# Patient Record
Sex: Female | Born: 1960 | Race: White | Hispanic: No | Smoking: Current every day smoker
Health system: Southern US, Community
[De-identification: ages and names within clinical notes are randomized; demographics above are authoritative.]

---

## 2015-03-08 DIAGNOSIS — I10 Essential (primary) hypertension: Secondary | ICD-10-CM | POA: Insufficient documentation

## 2015-03-08 DIAGNOSIS — R87613 High grade squamous intraepithelial lesion on cytologic smear of cervix (HGSIL): Secondary | ICD-10-CM | POA: Insufficient documentation

## 2015-03-08 DIAGNOSIS — F330 Major depressive disorder, recurrent, mild: Secondary | ICD-10-CM | POA: Insufficient documentation

## 2015-03-08 DIAGNOSIS — M810 Age-related osteoporosis without current pathological fracture: Secondary | ICD-10-CM | POA: Insufficient documentation

## 2016-01-03 DIAGNOSIS — F17210 Nicotine dependence, cigarettes, uncomplicated: Secondary | ICD-10-CM | POA: Insufficient documentation

## 2016-10-20 DIAGNOSIS — K580 Irritable bowel syndrome with diarrhea: Secondary | ICD-10-CM | POA: Insufficient documentation

## 2016-10-20 DIAGNOSIS — Z6820 Body mass index (BMI) 20.0-20.9, adult: Secondary | ICD-10-CM | POA: Insufficient documentation

## 2017-05-29 DIAGNOSIS — R918 Other nonspecific abnormal finding of lung field: Secondary | ICD-10-CM | POA: Insufficient documentation

## 2018-06-05 DIAGNOSIS — E782 Mixed hyperlipidemia: Secondary | ICD-10-CM | POA: Insufficient documentation

## 2018-12-05 DIAGNOSIS — Z681 Body mass index (BMI) 19 or less, adult: Secondary | ICD-10-CM | POA: Insufficient documentation

## 2018-12-05 DIAGNOSIS — L409 Psoriasis, unspecified: Secondary | ICD-10-CM | POA: Insufficient documentation

## 2019-06-09 DIAGNOSIS — E559 Vitamin D deficiency, unspecified: Secondary | ICD-10-CM | POA: Insufficient documentation

## 2019-11-02 DIAGNOSIS — M25552 Pain in left hip: Secondary | ICD-10-CM | POA: Insufficient documentation

## 2019-11-19 ENCOUNTER — Other Ambulatory Visit: Payer: Self-pay

## 2019-11-19 ENCOUNTER — Encounter: Payer: Self-pay | Admitting: Family Medicine

## 2019-11-19 ENCOUNTER — Ambulatory Visit: Payer: 59 | Admitting: Family Medicine

## 2019-11-19 VITALS — BP 120/72 | HR 87 | Ht 66.0 in | Wt 122.0 lb

## 2019-11-19 DIAGNOSIS — M533 Sacrococcygeal disorders, not elsewhere classified: Secondary | ICD-10-CM | POA: Diagnosis not present

## 2019-11-19 DIAGNOSIS — M25551 Pain in right hip: Secondary | ICD-10-CM | POA: Diagnosis not present

## 2019-11-19 DIAGNOSIS — M25552 Pain in left hip: Secondary | ICD-10-CM

## 2019-11-19 NOTE — Progress Notes (Signed)
Subjective:    CC: Low back pain  I, Raven Washington, am serving as a Neurosurgeon for Dr. Clementeen Washington.  HPI: Pt is a 59 y/o female presenting w/ c/o low back pain after suffering a fall on 10/04/19.  She locates her pain to across lower back from hip to hip when walking.  Had x-rays on 6/30 at her PCP brought CD. States PCP did not see anything wrong. On 7/12 went to [facility] and was told it was arthritis.   Radiating pain: yes from hip to hip LE numbness/tingling: no  LE weakness: yes R leg  Aggravating factors: walking too long Treatments tried: aleve, was given hydrocodone which has helped but doesn't like to take   Pertinent review of Systems: No fevers or chills  Relevant historical information: History pelvic fracture 2012 with pelvic hardware.   Objective:    Vitals:   11/19/19 1413  BP: 120/72  Pulse: 87  SpO2: (!) 87%   General: Well Developed, well nourished, and in no acute distress.   MSK:  LS-spine normal-appearing Tender palpation sacrum and coccyx. Normal lumbar motion. Lower extremity strength intact except noted below.  Right hip normal-appearing normal motion. Tender palpation greater trochanter. Hip abduction and external rotation strength diminished 4/5 with pain.  Left hip normal-appearing normal motion. Tender palpation greater trochanter Hip abduction and external rotation strength diminished 4/5 with pain.  Antalgic gait.  Lab and Radiology Results  X-RAY LEFT HIP (2+ VIEWS), 10/27/2019 11:59 AM   INDICATION: Left hip pain \ M25.552 Left hip pain  COMPARISON: None.   CONCLUSION:   1. No acute fracture or malalignment.  2. Mild degenerative changes of the left hip and sacroiliac joints  INDICATION: Low back and right hip pain   COMPARISON: None   TECHNIQUE: Lumbar spine 2 views. AP pelvis and right hip 2 views.   FINDINGS:   Lumbar spine: Osteopenia. Thoracolumbar dextroscoliosis. The disc spaces are preserved.  Small anterior osteophytes at L3 and L4. No fracture, dislocation, or destructive lesion   Pelvis/right hip: Orthopedic hardware right acetabulum. No abnormal periprosthetic lucencies. Osteopenia and moderate DJD right hip. No fracture, dislocation, or destructive lesion.    IMPRESSION:  1. Lumbar spine: Osteopenia, scoliosis, and mild spondylosis  2. Pelvis/right hip: Orthopedic hardware right acetabulum and moderate DJD right hip.   Electronically Signed by: Bryna Colander Specimen Collected: 10/15/19 2:55 PM Last Resulted: 10/15/19 2:58 PM   INDICATION: Low back and right hip pain   COMPARISON: None   TECHNIQUE: Lumbar spine 2 views. AP pelvis and right hip 2 views.   FINDINGS:   Lumbar spine: Osteopenia. Thoracolumbar dextroscoliosis. The disc spaces are preserved. Small anterior osteophytes at L3 and L4. No fracture, dislocation, or destructive lesion   Pelvis/right hip: Orthopedic hardware right acetabulum. No abnormal periprosthetic lucencies. Osteopenia and moderate DJD right hip. No fracture, dislocation, or destructive lesion.    IMPRESSION:  1. Lumbar spine: Osteopenia, scoliosis, and mild spondylosis  2. Pelvis/right hip: Orthopedic hardware right acetabulum and moderate DJD right hip.   Electronically Signed by: Bryna Colander Specimen Collected: 10/15/19 2:55 PM Last Resulted: 10/15/19 2:58 PM     I, Raven Washington, personally (independently) visualized and performed the interpretation of the images attached in this note.  Images provided by patient on a CD.      Impression and Recommendations:    Assessment and Plan: 59 y.o. female with pelvis and hip pain following fall about 6 weeks ago.  X-ray imaging so  far is unrevealing however my interpretation images is somewhat concerning for lucency in the superior pelvic ramus near the surgical hardware.  I think it is reasonable to proceed with CT scan of the pelvis to double check no fracture.  Addition we will  proceed with physical therapy trial and will recheck in about 4 weeks or so..  ] Orders Placed This Encounter  Procedures  . CT PELVIS WO CONTRAST    Standing Status:   Future    Standing Expiration Date:   11/18/2020    Order Specific Question:   Is patient pregnant?    Answer:   No    Order Specific Question:   Preferred imaging location?    Answer:   [facility]    Order Specific Question:   Is Oral Contrast requested for this exam?    Answer:   No oral contrast    Order Specific Question:   Reason for No Oral Contrast    Answer:   Other    Order Specific Question:   Please answer why no oral contrast is requested    Answer:   not needed    Order Specific Question:   Radiology Contrast Protocol - do NOT remove file path    Answer:   [email or web address]  . Ambulatory referral to Physical Therapy    Referral Priority:   Routine    Referral Type:   Physical Medicine    Referral Reason:   Specialty Services Required    Requested Specialty:   Physical Therapy    Number of Visits Requested:   1   No orders of the defined types were placed in this encounter.   Discussed warning signs or symptoms. Please see discharge instructions. Patient expresses understanding.   The above documentation has been reviewed and is accurate and complete Raven Washington, M.D.

## 2019-11-19 NOTE — Patient Instructions (Signed)
Thank you for coming in today. Plan for PT and Ct scan of the pelvis.  You should hear about both soon.  Let me know if you have not been contacted to schedule PT and or the CT scan in about a week.   Lets recheck in 4 weeks or sooner if needed.  I will get the CT results to you ASAP and may office a face to face visit with the result.

## 2019-11-26 ENCOUNTER — Ambulatory Visit (INDEPENDENT_AMBULATORY_CARE_PROVIDER_SITE_OTHER): Payer: 59

## 2019-11-26 ENCOUNTER — Other Ambulatory Visit: Payer: Self-pay

## 2019-11-26 DIAGNOSIS — M25551 Pain in right hip: Secondary | ICD-10-CM

## 2019-11-26 DIAGNOSIS — M533 Sacrococcygeal disorders, not elsewhere classified: Secondary | ICD-10-CM | POA: Diagnosis not present

## 2019-11-26 DIAGNOSIS — M25552 Pain in left hip: Secondary | ICD-10-CM

## 2019-11-27 NOTE — Progress Notes (Signed)
CT scan shows possible sacral osteonecrosis.  This could be secondary to your previous pelvic trauma.  If needed radiology recommends MRI.  Otherwise nothing significantly wrong seen on CT scan.  Would you like me to try to proceed with MRI at this point or would like follow-up?  I think either way is reasonable.

## 2019-11-28 ENCOUNTER — Telehealth: Payer: Self-pay | Admitting: *Deleted

## 2019-11-28 DIAGNOSIS — M25551 Pain in right hip: Secondary | ICD-10-CM

## 2019-11-28 DIAGNOSIS — M899 Disorder of bone, unspecified: Secondary | ICD-10-CM

## 2019-11-28 DIAGNOSIS — M533 Sacrococcygeal disorders, not elsewhere classified: Secondary | ICD-10-CM

## 2019-11-28 NOTE — Telephone Encounter (Signed)
MRI pelvis ordered

## 2019-11-28 NOTE — Telephone Encounter (Signed)
Pt returned call in reference to CT results and she would like to move forward with MRI.

## 2019-12-14 ENCOUNTER — Ambulatory Visit (INDEPENDENT_AMBULATORY_CARE_PROVIDER_SITE_OTHER): Payer: 59

## 2019-12-14 ENCOUNTER — Other Ambulatory Visit: Payer: Self-pay

## 2019-12-14 DIAGNOSIS — M533 Sacrococcygeal disorders, not elsewhere classified: Secondary | ICD-10-CM | POA: Diagnosis not present

## 2019-12-14 DIAGNOSIS — M25552 Pain in left hip: Secondary | ICD-10-CM

## 2019-12-14 DIAGNOSIS — M899 Disorder of bone, unspecified: Secondary | ICD-10-CM | POA: Diagnosis not present

## 2019-12-14 DIAGNOSIS — M25551 Pain in right hip: Secondary | ICD-10-CM | POA: Diagnosis not present

## 2019-12-16 NOTE — Progress Notes (Signed)
MRI pelvis shows small subtle fractures through part of the sacrum which looks like sacral insufficiency fractures.  These are fractures that will make it away through the bone and the bone is weak.  I think it is likely that you did break your sacrum when you fell and was seen in the evidence of that on the CT scan and MRI.  Please schedule follow-up with me in the near future to review these findings in detail and discuss next steps.  Effectively this should heal.

## 2019-12-24 ENCOUNTER — Encounter: Payer: Self-pay | Admitting: Family Medicine

## 2019-12-24 ENCOUNTER — Other Ambulatory Visit: Payer: Self-pay

## 2019-12-24 ENCOUNTER — Ambulatory Visit: Payer: 59 | Admitting: Family Medicine

## 2019-12-24 VITALS — BP 102/70 | HR 75 | Ht 66.0 in | Wt 122.0 lb

## 2019-12-24 DIAGNOSIS — M8448XD Pathological fracture, other site, subsequent encounter for fracture with routine healing: Secondary | ICD-10-CM

## 2019-12-24 DIAGNOSIS — M8000XD Age-related osteoporosis with current pathological fracture, unspecified site, subsequent encounter for fracture with routine healing: Secondary | ICD-10-CM | POA: Diagnosis not present

## 2019-12-24 NOTE — Progress Notes (Signed)
I, Raven Washington, LAT, ATC, am serving as scribe for Dr. Clementeen Washington.  Raven Washington is a 59 y.o. female who presents to [facility] at [facility] today for f/u of low back / buttock pain that began after she fell and landed on her back/buttocks on 10/04/19.  She was last seen by Dr. Denyse Washington on 11/19/19 was referred for a pelvis CT and MRI.  Since then, pt reports that she is feeling better and reports less pain overall.  She states that once she gets moving, she's doing ok.  She does have an existing history of osteoporosis.  She notes that she has had Reclast infusion with little benefit in the past.  She has not been on Prolia or Evenity yet.  Diagnostic imaging: Pelvis MRI- 12/14/19; Pelvis CT- 11/26/19   Pertinent review of systems: No fevers or chills  Relevant historical information: Osteoporosis   Exam:  BP 102/70 (BP Location: Right Arm, Patient Position: Sitting, Cuff Size: Normal)   Pulse 75   Ht 5\' 6"  (1.676 m)   Wt 122 lb (55.3 kg)   SpO2 94%   BMI 19.69 kg/m  General: Well Developed, well nourished, and in no acute distress.   MSK: Hip normal gait    Lab and Radiology Results CT PELVIS WO CONTRAST  Result Date: 11/27/2019 CLINICAL DATA:  Pain following fall several weeks prior EXAM: CT PELVIS WITHOUT CONTRAST TECHNIQUE: Multidetector CT imaging of the pelvis was performed following the standard protocol without intravenous contrast. COMPARISON:  None. FINDINGS: Urinary Tract: Urinary bladder is midline. The urinary bladder wall thickness is within normal limits. No distal ureteral lesion evident. Bowel: Bowel in the pelvic regions is not dilated. There is no evidence suggesting bowel wall thickening or bowel obstruction. Vascular/Lymphatic: There are foci of calcification in the iliac arteries bilaterally. No aneurysm in these vessels evident. Reproductive: Uterus is anteverted. No pelvic mass is appreciable on this study. Other:  No abnormal fluid or  abscess in the pelvis. Musculoskeletal: The patient has had previous surgical fixation through the acetabulum and mid inferior right iliac bone due to previous trauma. Alignment in these areas is anatomic. There is also surgical fixation in the right superior pubic ramus with alignment anatomic. There is no acute fracture or evident dislocation. Note that there is sclerosis throughout much of the sacrum without similar changes elsewhere. Marrow signal is normal in the coccyx. No bony destruction appreciable. There is no appreciable joint space narrowing. No erosive change noted in the hip joint regions. IMPRESSION: 1. There is relative sclerosis throughout much of the sacrum bilaterally with appearance of the coccyx normal. The appearance is concerning for degree of sacral osteonecrosis. There is no frank bony destruction or erosion in this region. Question chronic osteonecrosis secondary to previous pelvic trauma. Potentially MR of the sacrum could be helpful to further evaluate with respect to avascular necrosis. 2. Postoperative changes throughout the right acetabulum and superior pubic ramus with alignment in these areas essentially anatomic. No osteonecrosis in these areas. 3. No fracture or dislocation. Hip joints appear essentially unremarkable bilaterally. No appreciable sacroiliitis. 4. There are foci of arterial vascular calcification iliac arteries. 5.  Study otherwise unremarkable. Electronically Signed   By: 01/27/2020 III M.D.   On: 11/27/2019 08:22   MR PELVIS WO CONTRAST  Result Date: 12/15/2019 CLINICAL DATA:  Bony lesions in the pelvis on CT scan. EXAM: MRI PELVIS WITHOUT CONTRAST TECHNIQUE: Multiplanar multisequence MR imaging of the pelvis was performed. No intravenous  contrast was administered. COMPARISON:  CT pelvis 11/26/2019 FINDINGS: Bones and joints:Vertical fractures are present in both sacral ala with a transverse fracture component in the sacrum at the S3 level in a  characteristic appearance for sacral insufficiency fracture. No bony expansion or asymmetric lesion. Questionable nondisplaced fracture or stress fracture the right L5 transverse process. We do not currently show a characteristic pattern for avascular necrosis. The CT findings in the pelvis are likely due to reactive sclerosis related to the insufficiency fracture. Prior right acetabular, iliac bone, and right superior pubic ramus reconstruction with associated metal hardware. Mildly asymmetric spurring of the right femoral head and acetabulum compared to the left likely related to the old trauma. Muscles and tendons Unremarkable Other findings No additional significant findings. IMPRESSION: 1. Vertical fractures in both sacral ala with a transverse fracture component in the sacrum at the S3 level in a characteristic appearance for sacral insufficiency fracture. No bony expansion or asymmetric lesion. The CT findings in the pelvis are likely due to reactive sclerosis related to the insufficiency fracture. No underlying malignancy detected. 2. Questionable nondisplaced fracture or stress fracture the right L5 transverse process. 3. Prior right acetabular, iliac bone, and right superior pubic ramus reconstruction with associated metal hardware. 4. Mildly asymmetric degenerative spurring of the right femoral head and acetabulum compared to the left likely related to the old trauma. Electronically Signed   By: Raven Washington M.D.   On: 12/15/2019 10:19  I, Raven Washington, personally (independently) visualized and performed the interpretation of the images attached in this note. Agree with radiology read regarding pelvis MRI.     Assessment and Plan: 59 y.o. 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  Fortunately clinically improving.  However patient does have persistent osteoporosis with now pathologic fracture.  She had Reclast in the past with not great improvement.  She  already is taking vitamin D and has great vitamin D levels when checked at her PCPs office recently.  Calcium levels were also checked and were normal.  First choice for osteoporosis management would be Evenity monthly for 1 year.  Second choice would be Prolia.  We will work on authorization for [employer] and if not able then Prolia.  Possible that her PCP could take over Evenity or Prolia.  Watchful waiting for her fracture and treatment as above for osteoporosis.  Ideally transfer care to PCP.      Discussed warning signs or symptoms. Please see discharge instructions. Patient expresses understanding.   The above documentation has been reviewed and is accurate and complete Raven Washington, M.D.  Total encounter time 30 minutes including face-to-face time with the patient and charting on the date of service.

## 2019-12-24 NOTE — Patient Instructions (Signed)
Thank you for coming in today. The fracture should continue to improve over time.  We want to prevent future fractures by improving the bone mineral density.  I will work on authorization for [employer] or [employer].  Your PCP could probably do the Prolia but I can get it started now if Evenity if not approved.   I will try to coordinate this with your PCP.    Romosozumab injection What is this medicine? ROMOSOZUMAB (roe moe SOZ ue mab) increases bone formation. It is used to treat osteoporosis in women. This medicine may be used for other purposes; ask your health care provider or pharmacist if you have questions. COMMON BRAND NAME(S): EVENITY What should I tell my health care provider before I take this medicine? They need to know if you have any of these conditions:  dental disease or wear dentures  heart disease  history of stroke  kidney disease  low levels of calcium in the blood  an unusual or allergic reaction to romosozumab, other medicines, foods, dyes or preservatives  pregnant or trying to get pregnant  breast-feeding How should I use this medicine? This medicine is for injection under the skin. It is given by a healthcare professional in a hospital or clinic setting. Talk to your pediatrician about the use of this medicine in children. Special care may be needed. Overdosage: If you think you have taken too much of this medicine contact a poison control center or emergency room at once. NOTE: This medicine is only for you. Do not share this medicine with others. What if I miss a dose? Keep appointments for follow-up doses. It is important not to miss your dose. Call your doctor or healthcare professional if you are unable to keep an appointment. What may interact with this medicine? Interactions are not expected. This list may not describe all possible interactions. Give your health care provider a list of all the medicines, herbs, non-prescription drugs, or dietary  supplements you use. Also tell them if you smoke, drink alcohol, or use illegal drugs. Some items may interact with your medicine. What should I watch for while using this medicine? Your condition will be monitored carefully while you are receiving this medicine. You may need blood work done while you are taking this medicine. Some people who take this medicine have severe bone, joint, or muscle pain. This medicine may also increase your risk for jaw problems or a broken thigh bone. Tell your healthcare professional right away if you have severe pain in your jaw, bones, joints, or muscles. Tell your healthcare professional if you have any pain that does not go away or that gets worse. You should make sure you get enough calcium and vitamin D while you are taking this medicine. Discuss the foods you eat and the vitamins you take with your healthcare professional. Tell your dentist and dental surgeon that you are taking this medicine. You should not have major dental surgery while on this medicine. See your dentist to have a dental exam and fix any dental problems before starting this medicine. Take good care of your teeth while on this medicine. Make sure you see your dentist for regular follow-up appointments. What side effects may I notice from receiving this medicine? Side effects that you should report to your doctor or health care professional as soon as possible:  allergic reactions like skin rash, itching or hives, swelling of the face, lips, or tongue  bone pain  chest pain or chest tightness  jaw  pain, especially after dental work  signs and symptoms of a stroke like changes in vision; confusion; trouble speaking or understanding; severe headaches; sudden numbness or weakness of the face, arm or leg; trouble walking; dizziness; loss of balance or coordination  signs and symptoms of low calcium like fast heartbeat; muscle cramps; muscle pain; pain, tingling, numbness in the hands or feet;  seizures Side effects that usually do not require medical attention (report these to your doctor or health care professional if they continue or are bothersome):  headache  joint pain  pain, redness, or irritation at site where injected  pain, tingling, numbness in the hands or feet  swelling of ankles, feet, hands  trouble sleeping This list may not describe all possible side effects. Call your doctor for medical advice about side effects. You may report side effects to FDA at 1-800-FDA-1088. Where should I keep my medicine? This medicine is given in a hospital or clinic and will not be stored at home. NOTE: This sheet is a summary. It may not cover all possible information. If you have questions about this medicine, talk to your doctor, pharmacist, or health care provider.  2020 Elsevier/Gold Standard (2017-07-26 01:15:32)   Denosumab injection What is this medicine? DENOSUMAB (den oh sue mab) slows bone breakdown. Prolia is used to treat osteoporosis in women after menopause and in men, and in people who are taking corticosteroids for 6 months or more. Delton See is used to treat a high calcium level due to cancer and to prevent bone fractures and other bone problems caused by multiple myeloma or cancer bone metastases. Delton See is also used to treat giant cell tumor of the bone. This medicine may be used for other purposes; ask your health care provider or pharmacist if you have questions. COMMON BRAND NAME(S): Prolia, XGEVA What should I tell my health care provider before I take this medicine? They need to know if you have any of these conditions:  dental disease  having surgery or tooth extraction  infection  kidney disease  low levels of calcium or Vitamin D in the blood  malnutrition  on hemodialysis  skin conditions or sensitivity  thyroid or parathyroid disease  an unusual reaction to denosumab, other medicines, foods, dyes, or preservatives  pregnant or trying to  get pregnant  breast-feeding How should I use this medicine? This medicine is for injection under the skin. It is given by a health care professional in a hospital or clinic setting. A special MedGuide will be given to you before each treatment. Be sure to read this information carefully each time. For Prolia, talk to your pediatrician regarding the use of this medicine in children. Special care may be needed. For Delton See, talk to your pediatrician regarding the use of this medicine in children. While this drug may be prescribed for children as young as 13 years for selected conditions, precautions do apply. Overdosage: If you think you have taken too much of this medicine contact a poison control center or emergency room at once. NOTE: This medicine is only for you. Do not share this medicine with others. What if I miss a dose? It is important not to miss your dose. Call your doctor or health care professional if you are unable to keep an appointment. What may interact with this medicine? Do not take this medicine with any of the following medications:  other medicines containing denosumab This medicine may also interact with the following medications:  medicines that lower your chance of  fighting infection  steroid medicines like prednisone or cortisone This list may not describe all possible interactions. Give your health care provider a list of all the medicines, herbs, non-prescription drugs, or dietary supplements you use. Also tell them if you smoke, drink alcohol, or use illegal drugs. Some items may interact with your medicine. What should I watch for while using this medicine? Visit your doctor or health care professional for regular checks on your progress. Your doctor or health care professional may order blood tests and other tests to see how you are doing. Call your doctor or health care professional for advice if you get a fever, chills or sore throat, or other symptoms of a cold  or flu. Do not treat yourself. This drug may decrease your body's ability to fight infection. Try to avoid being around people who are sick. You should make sure you get enough calcium and vitamin D while you are taking this medicine, unless your doctor tells you not to. Discuss the foods you eat and the vitamins you take with your health care professional. See your dentist regularly. Brush and floss your teeth as directed. Before you have any dental work done, tell your dentist you are receiving this medicine. Do not become pregnant while taking this medicine or for 5 months after stopping it. Talk with your doctor or health care professional about your birth control options while taking this medicine. Women should inform their doctor if they wish to become pregnant or think they might be pregnant. There is a potential for serious side effects to an unborn child. Talk to your health care professional or pharmacist for more information. What side effects may I notice from receiving this medicine? Side effects that you should report to your doctor or health care professional as soon as possible:  allergic reactions like skin rash, itching or hives, swelling of the face, lips, or tongue  bone pain  breathing problems  dizziness  jaw pain, especially after dental work  redness, blistering, peeling of the skin  signs and symptoms of infection like fever or chills; cough; sore throat; pain or trouble passing urine  signs of low calcium like fast heartbeat, muscle cramps or muscle pain; pain, tingling, numbness in the hands or feet; seizures  unusual bleeding or bruising  unusually weak or tired Side effects that usually do not require medical attention (report to your doctor or health care professional if they continue or are bothersome):  constipation  diarrhea  headache  joint pain  loss of appetite  muscle pain  runny nose  tiredness  upset stomach This list may not  describe all possible side effects. Call your doctor for medical advice about side effects. You may report side effects to FDA at 1-800-FDA-1088. Where should I keep my medicine? This medicine is only given in a clinic, doctor's office, or other health care setting and will not be stored at home. NOTE: This sheet is a summary. It may not cover all possible information. If you have questions about this medicine, talk to your doctor, pharmacist, or health care provider.  2020 Elsevier/Gold Standard (2017-08-10 16:10:44)

## 2022-08-27 IMAGING — MR MR PELVIS W/O CM
7 series · 39 of 48 positions shown · non-contrast
Comparison: CT pelvis 11/26/2019

CLINICAL DATA: Bony lesions in the pelvis on CT scan.

EXAM:
MRI PELVIS WITHOUT CONTRAST
TECHNIQUE: Multiplanar multisequence MR imaging of the pelvis was performed. No
intravenous contrast was administered.

[Series 5: T2 fat-sat · sagittal · 4.0mm · 0.94mm/px · 6 of 33 slices shown]
[im 1/33]
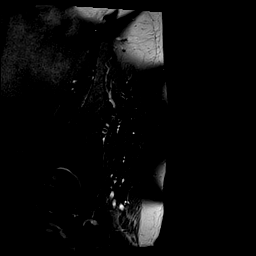
[im 7/33]
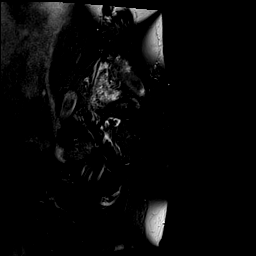
[im 13/33]
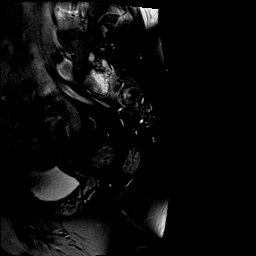
[im 20/33]
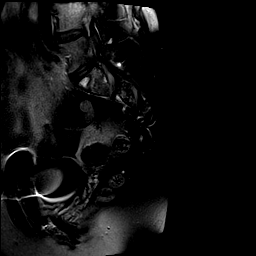
[im 26/33]
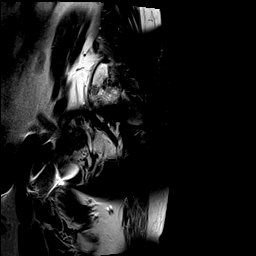
[im 33/33]
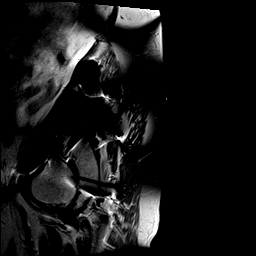

[Series 6: T1 · oblique · 3.0mm · 1.02mm/px · 6 of 31 slices shown (1 of 3)]
[im 1/31]
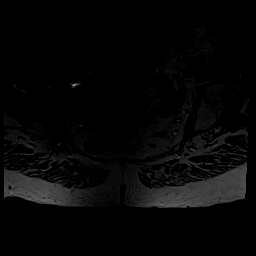
[im 7/31]
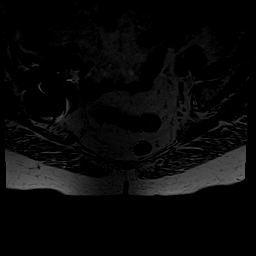
[im 13/31]
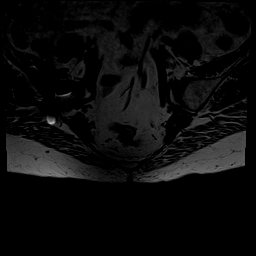
[im 19/31]
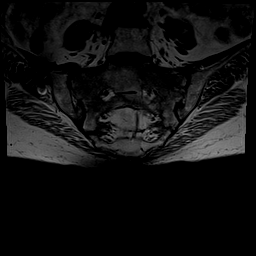
[im 25/31]
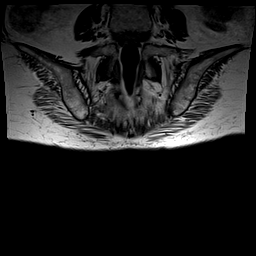
[im 31/31]
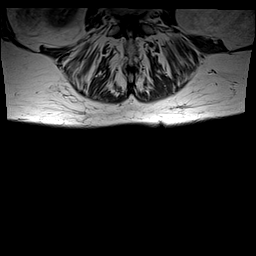

[Series 9: T1 · coronal · 5.0mm · 1.04mm/px · 6 of 32 slices shown (2 of 3)]
[im 1/32]
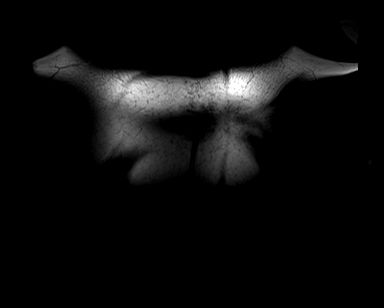
[im 7/32]
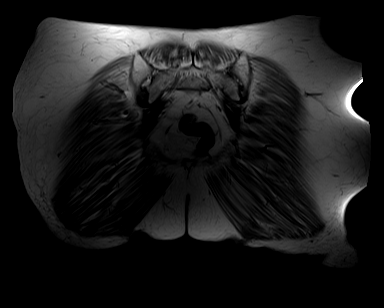
[im 13/32]
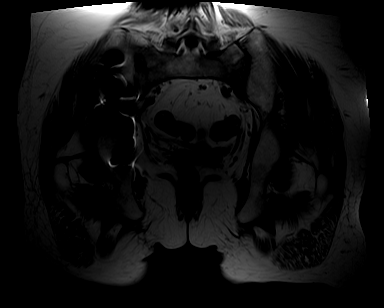
[im 19/32]
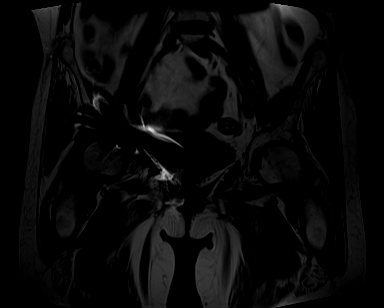
[im 25/32]
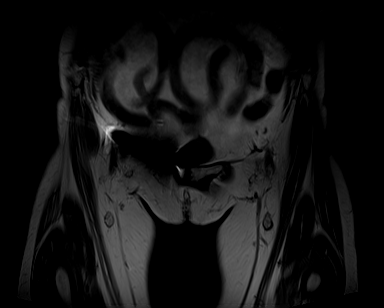
[im 32/32]
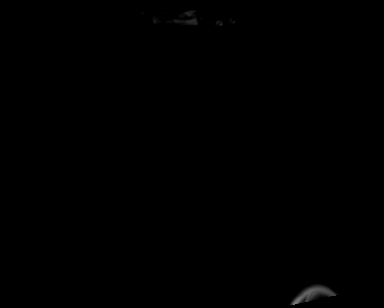

[Series 10: STIR · coronal · 5.0mm · 0.78mm/px · 6 of 32 slices shown (1 of 3)]
[im 1/32]
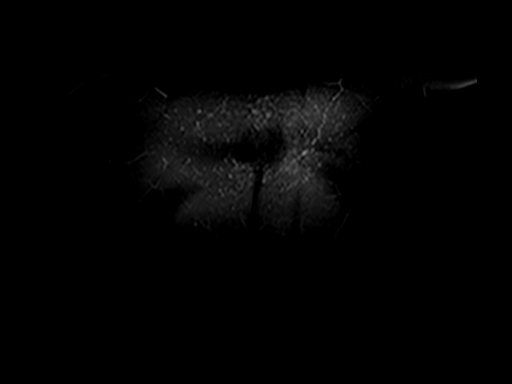
[im 7/32]
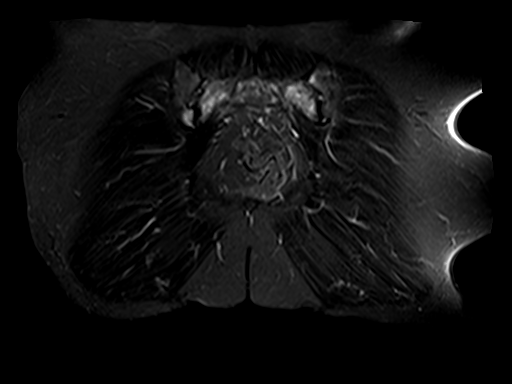
[im 13/32]
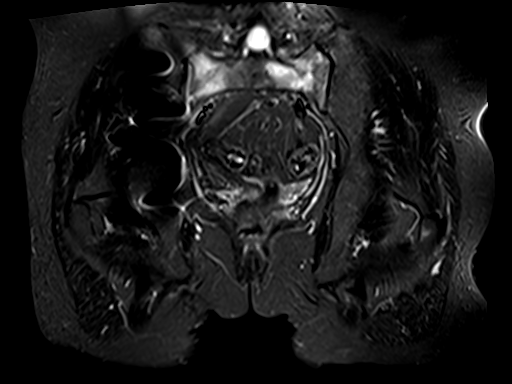
[im 19/32]
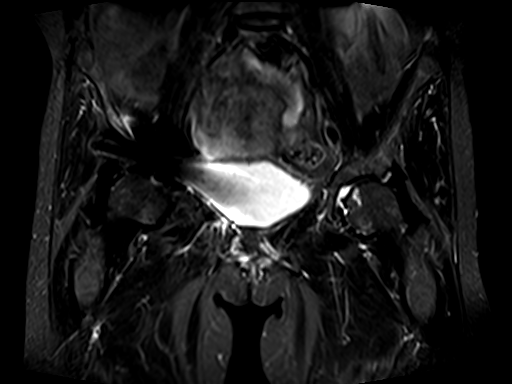
[im 25/32]
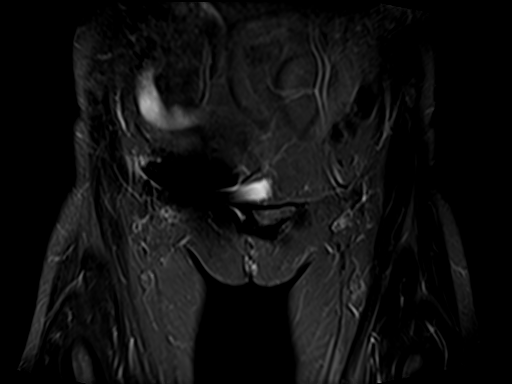
[im 32/32]
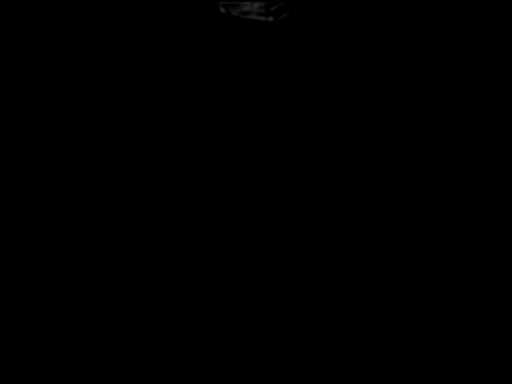

[Series 11: T1 · axial · 5.0mm · 1.56mm/px · z∈[-70,+151]mm · 6 of 35 slices shown (3 of 3)]
[im 1/35]
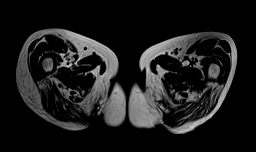
[im 7/35]
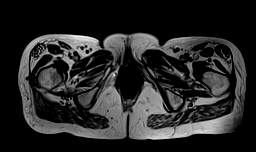
[im 14/35]
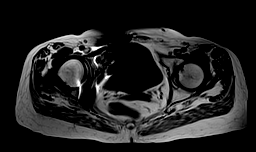
[im 21/35]
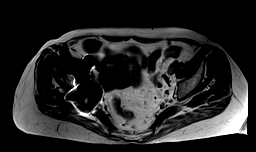
[im 28/35]
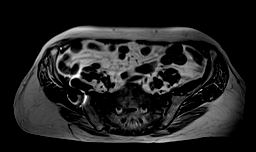
[im 35/35]
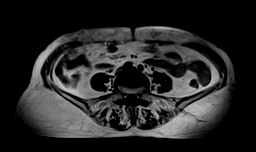

[Series 12: STIR · axial · 5.0mm · 0.78mm/px · z∈[-70,+151]mm · 6 of 35 slices shown (2 of 3)]
[im 1/35]
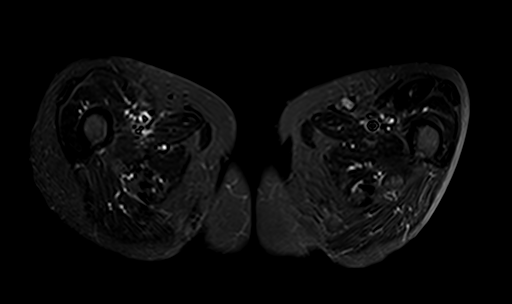
[im 7/35]
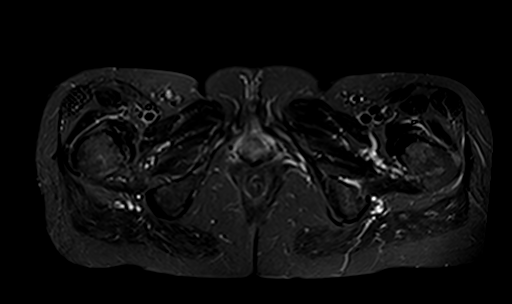
[im 14/35]
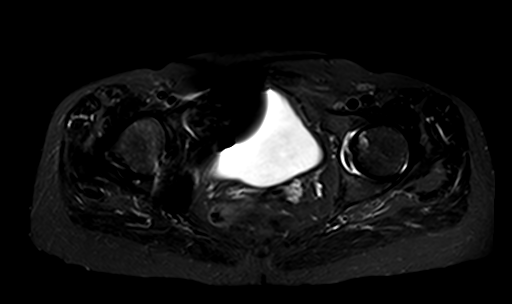
[im 21/35]
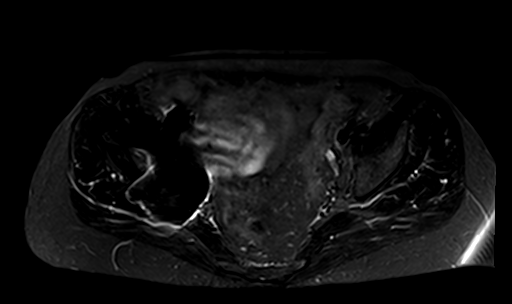
[im 28/35]
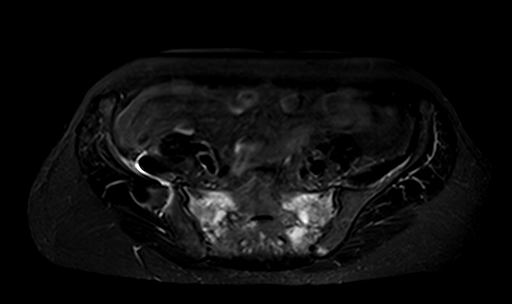
[im 35/35]
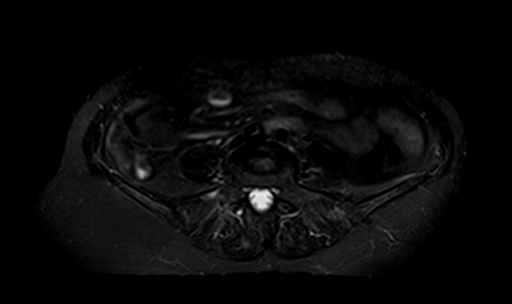

[Series 13: STIR · sagittal · 4.0mm · 0.47mm/px · 3 of 65 slices shown (3 of 3)]
[im 1/65]
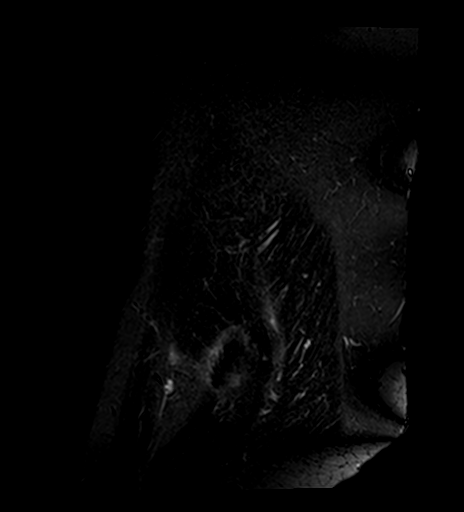
[im 12/65]
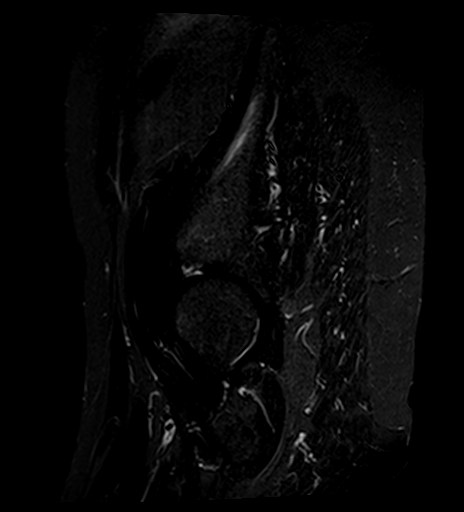
[im 18/65]
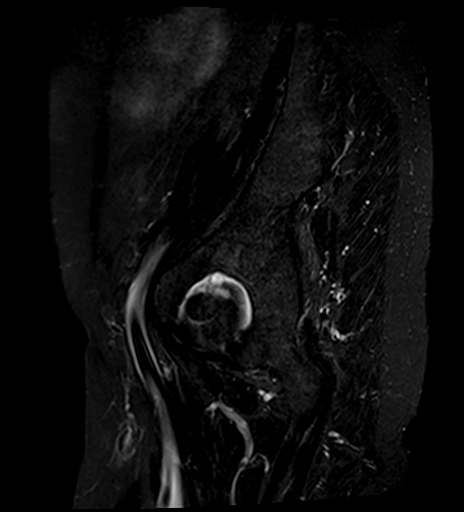

[39 of 48 positions shown; findings below may reference images not displayed]

FINDINGS: Bones and joints:Vertical fractures are present in both sacral ala
with a transverse fracture component in the sacrum at the S3 level
in a characteristic appearance for sacral insufficiency fracture. No
bony expansion or asymmetric lesion. Questionable nondisplaced
fracture or stress fracture the right L5 transverse process.

We do not currently show a characteristic pattern for avascular
necrosis. The CT findings in the pelvis are likely due to reactive
sclerosis related to the insufficiency fracture.

Prior right acetabular, iliac bone, and right superior pubic ramus
reconstruction with associated metal hardware. Mildly asymmetric
spurring of the right femoral head and acetabulum compared to the
left likely related to the old trauma.

Muscles and tendons

Unremarkable

Other findings

No additional significant findings.
IMPRESSION: 1. Vertical fractures in both sacral ala with a transverse fracture
component in the sacrum at the S3 level in a characteristic
appearance for sacral insufficiency fracture. No bony expansion or
asymmetric lesion. The CT findings in the pelvis are likely due to
reactive sclerosis related to the insufficiency fracture. No
underlying malignancy detected.
2. Questionable nondisplaced fracture or stress fracture the right
L5 transverse process.
3. Prior right acetabular, iliac bone, and right superior pubic
ramus reconstruction with associated metal hardware.
4. Mildly asymmetric degenerative spurring of the right femoral head
and acetabulum compared to the left likely related to the old
trauma.
